# Patient Record
Sex: Male | Born: 2000 | Race: White | Hispanic: No | Marital: Single | Smoking: Never smoker
Health system: Southern US, Community
[De-identification: ages and names within clinical notes are randomized; demographics above are authoritative.]

## PROBLEM LIST (undated history)

## (undated) DIAGNOSIS — C801 Malignant (primary) neoplasm, unspecified: Secondary | ICD-10-CM

## (undated) HISTORY — PX: KIDNEY SURGERY: SHX687

---
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
## 2022-02-04 ENCOUNTER — Other Ambulatory Visit: Payer: Self-pay

## 2022-02-04 ENCOUNTER — Inpatient Hospital Stay: Payer: BC Managed Care – PPO | Attending: Oncology

## 2022-02-04 ENCOUNTER — Inpatient Hospital Stay (HOSPITAL_BASED_OUTPATIENT_CLINIC_OR_DEPARTMENT_OTHER): Payer: BC Managed Care – PPO | Admitting: Oncology

## 2022-02-04 VITALS — BP 109/76 | HR 75 | Temp 98.1°F | Resp 20 | Wt 128.4 lb

## 2022-02-04 DIAGNOSIS — D696 Thrombocytopenia, unspecified: Secondary | ICD-10-CM | POA: Diagnosis not present

## 2022-02-04 DIAGNOSIS — Z85528 Personal history of other malignant neoplasm of kidney: Secondary | ICD-10-CM | POA: Insufficient documentation

## 2022-02-04 DIAGNOSIS — D72819 Decreased white blood cell count, unspecified: Secondary | ICD-10-CM | POA: Insufficient documentation

## 2022-02-04 LAB — CBC WITH DIFFERENTIAL (CANCER CENTER ONLY)
Abs Immature Granulocytes: 0.01 10*3/uL (ref 0.00–0.07)
Basophils Absolute: 0.1 10*3/uL (ref 0.0–0.1)
Basophils Relative: 2 %
Eosinophils Absolute: 0.1 10*3/uL (ref 0.0–0.5)
Eosinophils Relative: 2 %
HCT: 43.8 % (ref 39.0–52.0)
Hemoglobin: 15.6 g/dL (ref 13.0–17.0)
Immature Granulocytes: 0 %
Lymphocytes Relative: 41 %
Lymphs Abs: 2.3 10*3/uL (ref 0.7–4.0)
MCH: 30.2 pg (ref 26.0–34.0)
MCHC: 35.6 g/dL (ref 30.0–36.0)
MCV: 84.7 fL (ref 80.0–100.0)
Monocytes Absolute: 0.4 10*3/uL (ref 0.1–1.0)
Monocytes Relative: 8 %
Neutro Abs: 2.6 10*3/uL (ref 1.7–7.7)
Neutrophils Relative %: 47 %
Platelet Count: 240 10*3/uL (ref 150–400)
RBC: 5.17 MIL/uL (ref 4.22–5.81)
RDW: 12.6 % (ref 11.5–15.5)
WBC Count: 5.6 10*3/uL (ref 4.0–10.5)
nRBC: 0 % (ref 0.0–0.2)

## 2022-02-04 NOTE — Progress Notes (Signed)
Hematology and Oncology Follow Up Visit  Curtis Porter [id number] 01/30/2001 21 y.o. 02/04/2022 3:14 PM Patient, No Pcp PerNo ref. provider found   Principle Diagnosis: 21 year old with leukocytopenia and thrombocytopenia noted in March 2023.  Repeat laboratory testing in April 2023 showed correction of these abnormalities.   Prior Therapy: He is status post nephrectomy followed by adjuvant chemotherapy and radiation due to pediatric cancer diagnosis in 2005.  Current therapy: Active surveillance.  Interim History: [name] returns today for a follow-up.  Since last visit, he reports no major changes in his health.  He reports no recent hospitalizations or illnesses.  He denies any recurrent sinopulmonary infections or skin rash.  His performance status quality of life remains unchanged.     Medications: I have reviewed the patient's current medications.  Current Outpatient Medications  Medication Sig Dispense Refill   cetirizine (ZYRTEC) 10 MG tablet Take 10 mg by mouth daily.     loratadine (CLARITIN) 10 MG tablet Take 10 mg by mouth as needed for allergies. (Patient not taking: Reported on 10/02/2021)     No current facility-administered medications for this visit.     Allergies:  Allergies  Allergen Reactions   Hydrocodone-Acetaminophen Rash      Physical Exam:   ECOG: 0    General appearance: Comfortable appearing without any discomfort Head: Normocephalic without any trauma Oropharynx: Mucous membranes are moist and pink without any thrush or ulcers. Eyes: Pupils are equal and round reactive to light. Lymph nodes: No cervical, supraclavicular, inguinal or axillary lymphadenopathy.   Heart:regular rate and rhythm.  S1 and S2 without leg edema. Lung: Clear without any rhonchi or wheezes.  No dullness to percussion. Abdomin: Soft, nontender, nondistended with good bowel sounds.  No hepatosplenomegaly. Musculoskeletal: No joint deformity or effusion.  Full range of  motion noted. Neurological: No deficits noted on motor, sensory and deep tendon reflex exam. Skin: No petechial rash or dryness.  Appeared moist.      Lab Results: Lab Results  Component Value Date   WBC 4.9 10/02/2021   HGB 14.1 10/02/2021   HCT 40.8 10/02/2021   MCV 87.2 10/02/2021   PLT 246 10/02/2021     Chemistry      Component Value Date/Time   NA 136 11/28/2020 0919   K 4.7 11/28/2020 0919   CL 101 11/28/2020 0919   CO2 28 11/28/2020 0919   BUN 18 11/28/2020 0919   CREATININE 1.16 (H) 11/28/2020 0919      Component Value Date/Time   CALCIUM 9.4 11/28/2020 0919   ALKPHOS 73 11/28/2020 0919   AST 111 (H) 11/28/2020 0919   ALT 41 11/28/2020 0919   BILITOT 1.6 (H) 11/28/2020 0919        Impression and Plan:  21 year old with:  1.  Leukocytopenia with thrombocytopenia detected on CBC in March 2023.  CBC in April showed normal parameters at that time.    Laboratory data from today reviewed and continues to show normal findings.  His white cell count 5.6 with a hemoglobin of 15 and a platelet count of 240.  The etiology of these findings were discussed at this time and likely represent a lab error rather than a hematological condition.  His laboratory testing was repeated on 2 separate occasions and showed normal CBC.  No further intervention or work-up is needed at this time.   2.  Pediatric kidney cancer remains in remission after initially diagnosed in 2005.  No additional testing is needed.     3.  Follow-up: I am happy to see him in the future as needed.   30  minutes were spent on this encounter.  The time was dedicated to reviewing laboratory data, differential diagnosis, management choices and future plan of care discussion.    Zola Button, MD 8/15/20233:14 PM
